# Patient Record
Sex: Female | Born: 1945 | Race: White | Hispanic: No | Marital: Married
Health system: Southern US, Community
[De-identification: ages and names within clinical notes are randomized; demographics above are authoritative.]

---

## 2019-06-01 DIAGNOSIS — I10 Essential (primary) hypertension: Secondary | ICD-10-CM | POA: Insufficient documentation

## 2019-06-01 DIAGNOSIS — K2270 Barrett's esophagus without dysplasia: Secondary | ICD-10-CM | POA: Insufficient documentation

## 2019-06-06 DIAGNOSIS — K219 Gastro-esophageal reflux disease without esophagitis: Secondary | ICD-10-CM | POA: Insufficient documentation

## 2019-06-06 DIAGNOSIS — R490 Dysphonia: Secondary | ICD-10-CM | POA: Insufficient documentation

## 2019-06-06 DIAGNOSIS — J382 Nodules of vocal cords: Secondary | ICD-10-CM | POA: Insufficient documentation

## 2019-08-08 DIAGNOSIS — H6123 Impacted cerumen, bilateral: Secondary | ICD-10-CM | POA: Insufficient documentation

## 2019-08-08 DIAGNOSIS — H900 Conductive hearing loss, bilateral: Secondary | ICD-10-CM | POA: Insufficient documentation

## 2019-11-07 ENCOUNTER — Ambulatory Visit (INDEPENDENT_AMBULATORY_CARE_PROVIDER_SITE_OTHER): Payer: Medicare Other | Admitting: Podiatry

## 2019-11-07 ENCOUNTER — Other Ambulatory Visit: Payer: Self-pay

## 2019-11-07 ENCOUNTER — Ambulatory Visit (INDEPENDENT_AMBULATORY_CARE_PROVIDER_SITE_OTHER): Payer: Medicare Other

## 2019-11-07 DIAGNOSIS — M25572 Pain in left ankle and joints of left foot: Secondary | ICD-10-CM | POA: Diagnosis not present

## 2019-11-07 DIAGNOSIS — G8929 Other chronic pain: Secondary | ICD-10-CM

## 2019-11-07 DIAGNOSIS — M659 Synovitis and tenosynovitis, unspecified: Secondary | ICD-10-CM

## 2019-11-10 ENCOUNTER — Telehealth: Payer: Self-pay | Admitting: Podiatry

## 2019-11-10 NOTE — Telephone Encounter (Signed)
Patient thought that you were going to order MRI for her?

## 2019-11-14 NOTE — Telephone Encounter (Signed)
Pt called and wanted to know about the MRI , pt states that she has a MRI disc from aprev dr in [place] with in the year can we use that or no she would like to know either way please advise

## 2019-11-15 NOTE — Progress Notes (Signed)
   HPI: 74 y.o. female presenting today as a new patient for evaluation of left ankle pain that is been going on for greater than 1 year now.  Patient was seeing another physician in [place] and also saw Dr. Elijah Birk, podiatry here in [place] who started cortisone injections to the left foot and ankle.  She continues to experience pain despite multiple conservative treatment modalities.  Pain is aggravated by walking and standing.  She has tried anti-inflammatory injections, ankle brace, cam boot immobilization, and anti-inflammatory oral Mobic without any significant improvement.  She presents today for further treatment evaluation  No past medical history on file.   Physical Exam: General: The patient is alert and oriented x3 in no acute distress.  Dermatology: Skin is warm, dry and supple bilateral lower extremities. Negative for open lesions or macerations.  Vascular: Palpable pedal pulses bilaterally. No edema or erythema noted. Capillary refill within normal limits.  Neurological: Epicritic and protective threshold grossly intact bilaterally.   Musculoskeletal Exam: Range of motion within normal limits to all pedal and ankle joints bilateral. Muscle strength 5/5 in all groups bilateral.  Tenderness to palpation noted to the medial anterior and lateral aspects of the left ankle joint.  History of first MTPJ arthrodesis left foot with negative range of motion to the first MTPJ  Radiographic Exam:  Normal osseous mineralization. Joint spaces preserved. No fracture/dislocation/boney destruction.  Hardware noted to the first MTPJ left foot consistent with the patient's given history of arthrodesis to the left great toe joint  Assessment: 1.  Chronic left ankle pain 2.  DJD/synovitis left ankle 3.  History of first MTPJ arthrodesis left   Plan of Care:  1. Patient evaluated. X-Rays reviewed.  2.  The patient has had this pain now for greater than 1 year and is tried multiple conservative  modalities without any improvement. 3.  Today we will order MRI left ankle 4.  Patient cannot take oral NSAIDs 5.  Return to clinic in 3 weeks to review MRI results      Felecia Shelling, DPM [facility]  Dr. Felecia Shelling, DPM    [address] [place], [place] [address]                Office [phone]  Fax [phone]

## 2019-11-16 ENCOUNTER — Telehealth: Payer: Self-pay | Admitting: *Deleted

## 2019-11-16 DIAGNOSIS — M25572 Pain in left ankle and joints of left foot: Secondary | ICD-10-CM

## 2019-11-16 DIAGNOSIS — G8929 Other chronic pain: Secondary | ICD-10-CM

## 2019-11-16 NOTE — Telephone Encounter (Signed)
Yes, I'm sorry. Please order MRI left ankle w/out contrast. Thanks, Dr. Logan Bores

## 2019-11-16 NOTE — Telephone Encounter (Signed)
Faxed orders, clinicals and demographics to [facility] for pre-cert and scheduling. 

## 2019-11-16 NOTE — Telephone Encounter (Signed)
-----   Message from Felecia Shelling, DPM sent at 11/15/2019  6:37 PM EDT ----- Regarding: MRI left ankle Please order MRI left ankle without contrast  Diagnosis: Chronic left ankle pain/DJD/synovitis greater than 1 year  Thanks, Dr. Logan Bores

## 2019-11-22 ENCOUNTER — Other Ambulatory Visit: Payer: Self-pay | Admitting: Podiatry

## 2019-11-22 DIAGNOSIS — M659 Synovitis and tenosynovitis, unspecified: Secondary | ICD-10-CM

## 2019-11-30 ENCOUNTER — Ambulatory Visit: Payer: Medicare Other | Admitting: Podiatry

## 2019-12-15 ENCOUNTER — Other Ambulatory Visit: Payer: Medicare Other

## 2019-12-21 ENCOUNTER — Other Ambulatory Visit: Payer: Medicare Other

## 2019-12-29 ENCOUNTER — Ambulatory Visit
Admission: RE | Admit: 2019-12-29 | Discharge: 2019-12-29 | Disposition: A | Discharge status: Home / Self Care | Payer: Medicare Other | Source: Ambulatory Visit | Attending: Podiatry | Admitting: Podiatry

## 2019-12-29 ENCOUNTER — Ambulatory Visit
Admission: RE | Admit: 2019-12-29 | Discharge: 2019-12-29 | Disposition: A | Discharge status: Home / Self Care | Payer: Self-pay | Source: Ambulatory Visit | Attending: Podiatry | Admitting: Podiatry

## 2019-12-29 ENCOUNTER — Other Ambulatory Visit: Payer: Self-pay

## 2019-12-29 ENCOUNTER — Other Ambulatory Visit: Payer: Self-pay | Admitting: Podiatry

## 2019-12-29 DIAGNOSIS — M25572 Pain in left ankle and joints of left foot: Secondary | ICD-10-CM

## 2019-12-29 DIAGNOSIS — G8929 Other chronic pain: Secondary | ICD-10-CM

## 2020-01-04 ENCOUNTER — Ambulatory Visit (INDEPENDENT_AMBULATORY_CARE_PROVIDER_SITE_OTHER): Payer: Medicare Other | Admitting: Podiatry

## 2020-01-04 ENCOUNTER — Other Ambulatory Visit: Payer: Self-pay

## 2020-01-04 DIAGNOSIS — M25572 Pain in left ankle and joints of left foot: Secondary | ICD-10-CM | POA: Diagnosis not present

## 2020-01-04 DIAGNOSIS — G8929 Other chronic pain: Secondary | ICD-10-CM

## 2020-01-04 DIAGNOSIS — M66872 Spontaneous rupture of other tendons, left ankle and foot: Secondary | ICD-10-CM

## 2020-01-04 DIAGNOSIS — M76822 Posterior tibial tendinitis, left leg: Secondary | ICD-10-CM

## 2020-01-04 NOTE — Progress Notes (Signed)
   HPI: 74 y.o. female presenting today for follow-up evaluation regarding left medial ankle pain.  Patient's had this pain for greater than 1 year despite multiple conservative treatment modalities.  At 1 point the patient was actually recommended to have surgery in [place] however she moved here to [place] without having surgery.  Last visit on 11/07/2019 MRI was ordered.  She presents today to review MRI results and discuss further treatment options.  There is been no change in her left ankle and she continues to have chronic pain  No past medical history on file.   Physical Exam: General: The patient is alert and oriented x3 in no acute distress.  Dermatology: Skin is warm, dry and supple bilateral lower extremities. Negative for open lesions or macerations.  Vascular: Palpable pedal pulses bilaterally. No edema or erythema noted. Capillary refill within normal limits.  Neurological: Epicritic and protective threshold grossly intact bilaterally.   Musculoskeletal Exam: Range of motion within normal limits to all pedal and ankle joints bilateral. Muscle strength 5/5 in all groups bilateral.  Pain on palpation along the posterior tibial tendon left  MRI impression 12/29/2019: 1. Severe tendinosis of the posterior tibial tendon with a partial-thickness tear proximally at the level of the distal tibia and mild tenosynovitis. 2. Mild tendinosis of the peroneus brevis. 3. A 5 mm osteochondral lesion involving the medial corner of the talar dome with partial-thickness overlying cartilage loss and mild subchondral marrow edema. Partial-thickness cartilage loss of the adjacent medial corner of the tibial plafond with subchondral marrow edema.  Assessment: 1.  Nontraumatic tear of posterior tibial tendon left 2.  Chronic posterior tibial tendinosis   Plan of Care:  1. Patient evaluated.  MRI reviewed.  2. Today we discussed the conservative versus surgical management of the  presenting pathology. The patient opts for surgical management. All possible complications and details of the procedure were explained. All patient questions were answered. No guarantees were expressed or implied. 3. Authorization for surgery was initiated today. Surgery will consist of repair posterior tibial tendon left with possible application of amniotic tissue graft 4.  Order placed for wheelchair.  Patient has a walker at home to use postoperatively 5.  Return to clinic 1 week postop        Felecia Shelling, DPM [facility]  Dr. Felecia Shelling, DPM    [address] [place], [place] [address]                Office [phone]  Fax [phone]

## 2020-01-05 ENCOUNTER — Telehealth: Payer: Self-pay

## 2020-01-05 NOTE — Telephone Encounter (Signed)
DOS 01/19/2020  REPAIR POSTERIOR TIBIAL TENDON LT - 28200  [employer] MEDICARE EFFECTIVE DATE - 07/15/19  PLAN DEDUCTIBLE - $0.00 OUT OF POCKET - Member's plan does not have an Out-of-Pocket Maximum  CO-INSURANCE 0% / Visit OUTPATIENT SURGERY 0% / Visit OUTPATIENT HOSPITAL COPAY $0 / Visit OUTPATIENT SURGERY $0 / Visit OUTPATIENT HOSPITAL  Notification or Prior Authorization is not required for the requested services  Decision ID #:[id number]

## 2020-01-11 ENCOUNTER — Other Ambulatory Visit: Payer: Self-pay | Admitting: Podiatry

## 2020-01-11 DIAGNOSIS — M25572 Pain in left ankle and joints of left foot: Secondary | ICD-10-CM

## 2020-01-11 DIAGNOSIS — M66872 Spontaneous rupture of other tendons, left ankle and foot: Secondary | ICD-10-CM

## 2020-01-11 DIAGNOSIS — G8929 Other chronic pain: Secondary | ICD-10-CM

## 2020-01-11 DIAGNOSIS — M76822 Posterior tibial tendinitis, left leg: Secondary | ICD-10-CM

## 2020-01-19 ENCOUNTER — Other Ambulatory Visit: Payer: Self-pay | Admitting: Podiatry

## 2020-01-19 ENCOUNTER — Encounter: Payer: Self-pay | Admitting: Podiatry

## 2020-01-19 DIAGNOSIS — M76822 Posterior tibial tendinitis, left leg: Secondary | ICD-10-CM | POA: Diagnosis not present

## 2020-01-19 DIAGNOSIS — M66872 Spontaneous rupture of other tendons, left ankle and foot: Secondary | ICD-10-CM | POA: Diagnosis not present

## 2020-01-19 MED ORDER — MELOXICAM 15 MG PO TABS: 15.0000 mg | ORAL_TABLET | Freq: Every day | ORAL | 1 refills | Status: DC

## 2020-01-19 MED ORDER — DOXYCYCLINE HYCLATE 100 MG PO TABS: 100.0000 mg | ORAL_TABLET | Freq: Two times a day (BID) | ORAL | 0 refills | Status: DC

## 2020-01-19 MED ORDER — OXYCODONE-ACETAMINOPHEN 5-325 MG PO TABS: 1.0000 | ORAL_TABLET | ORAL | 0 refills | Status: DC | PRN

## 2020-01-19 NOTE — Progress Notes (Signed)
PRN postop 

## 2020-01-25 ENCOUNTER — Other Ambulatory Visit: Payer: Self-pay

## 2020-01-25 ENCOUNTER — Ambulatory Visit (INDEPENDENT_AMBULATORY_CARE_PROVIDER_SITE_OTHER): Payer: Medicare Other | Admitting: Podiatry

## 2020-01-25 ENCOUNTER — Encounter: Payer: Self-pay | Admitting: Podiatry

## 2020-01-25 ENCOUNTER — Ambulatory Visit: Payer: Medicare Other

## 2020-01-25 DIAGNOSIS — M76822 Posterior tibial tendinitis, left leg: Secondary | ICD-10-CM

## 2020-01-25 DIAGNOSIS — Z9889 Other specified postprocedural states: Secondary | ICD-10-CM

## 2020-01-25 NOTE — Progress Notes (Signed)
   Subjective:  Patient presents today status post posterior tibial tendon repair left. DOS: 01/19/2020.  Patient states that she is doing very well.  She has no pain.  The cast is very comfortable.  She has been nonweightbearing as directed.  No new complaints at this time  No past medical history on file.    Objective/Physical Exam Neurovascular status intact.  Cast left intact today.  Capillary refill within normal limits.  Patient is able to move her toes comfortably.  No sign of infectious process noted. No dehiscence. No active bleeding noted.    Assessment: 1. s/p PT tendon repair left. DOS: 01/19/2020   Plan of Care:  1. Patient was evaluated. 2.  Cast left intact today.  Continue nonweightbearing 3.  Return to clinic in 2 weeks for cast removal and to initiate physical therapy.  At this time a cam boot will be dispensed and she will continue nonweightbearing for an additional week and then slowly transition to WB in the CAM boot 4.  When physical therapy is initiated, the patient requested to have physical therapy closer to their home  Felecia Shelling, DPM [facility]  Dr. Felecia Shelling, DPM    [address]                                        [place], [place] [address]                Office [phone]  Fax [phone]

## 2020-01-25 NOTE — Addendum Note (Signed)
Addended by: Hadley Pen R on: 01/25/2020 11:26 AM   Modules accepted: Orders

## 2020-02-01 ENCOUNTER — Encounter: Payer: Medicare Other | Admitting: Podiatry

## 2020-02-06 ENCOUNTER — Telehealth: Payer: Self-pay | Admitting: *Deleted

## 2020-02-06 NOTE — Telephone Encounter (Signed)
Pt states she had surgery on her ankle and will be coming into the office for a cast removal and wanted to know if she would be able to weight bear after the removal in a boot.

## 2020-02-06 NOTE — Telephone Encounter (Signed)
Left message informing pt Dr. Logan Bores is not in the office this week and that with this type of surgery she may be put back into a cast for positioning and protection and if she is put in a boot she would probably not be weight bearing, may be able to use a knee scooter.

## 2020-02-13 ENCOUNTER — Other Ambulatory Visit: Payer: Self-pay

## 2020-02-13 ENCOUNTER — Ambulatory Visit (INDEPENDENT_AMBULATORY_CARE_PROVIDER_SITE_OTHER): Payer: Medicare Other | Admitting: Podiatry

## 2020-02-13 DIAGNOSIS — M76822 Posterior tibial tendinitis, left leg: Secondary | ICD-10-CM

## 2020-02-13 DIAGNOSIS — M66872 Spontaneous rupture of other tendons, left ankle and foot: Secondary | ICD-10-CM

## 2020-02-13 DIAGNOSIS — Z9889 Other specified postprocedural states: Secondary | ICD-10-CM

## 2020-02-13 NOTE — Progress Notes (Signed)
   Subjective:  Patient presents today status post posterior tibial tendon repair left. DOS: 01/19/2020.  Patient states she continues to have no pain.  She has been nonweightbearing in the cast as directed.  No new complaints at this time.  She presents today for cast removal and to initiate physical therapy  No past medical history on file.  Objective/Physical Exam Neurovascular status intact.  Skin incisions appear to be well coapted with staples intact. No sign of infectious process noted. No dehiscence. No active bleeding noted. Moderate edema noted to the surgical extremity.  Assessment: 1. s/p PT tendon repair left. DOS: 01/19/2020  Plan of Care:  1. Patient was evaluated. 2.  Cast was removed today.  Patient may begin to transition from nonweightbearing to weightbearing in the cam boot.  Cam boot dispensed today. 3.  Prescription provided today for physical therapy.  Patient lives in [place], [place] and would like physical therapy closer to where they live.  Prescription provided for them to take to any physical therapy location 4.  Return to clinic in 4 weeks  *Going to visit friends next week in [place], [place].  Also going to [facility], end of September.  Felecia Shelling, DPM [facility]  Dr. Felecia Shelling, DPM    [address]                                        [place], [place] [address]                Office [phone]  Fax [phone]

## 2020-02-20 ENCOUNTER — Encounter: Payer: Medicare Other | Admitting: Podiatry

## 2020-03-21 ENCOUNTER — Encounter: Payer: Medicare Other | Admitting: Podiatry

## 2020-03-26 ENCOUNTER — Ambulatory Visit (INDEPENDENT_AMBULATORY_CARE_PROVIDER_SITE_OTHER): Payer: Medicare Other | Admitting: Podiatry

## 2020-03-26 ENCOUNTER — Other Ambulatory Visit: Payer: Self-pay

## 2020-03-26 ENCOUNTER — Ambulatory Visit (INDEPENDENT_AMBULATORY_CARE_PROVIDER_SITE_OTHER): Payer: Medicare Other

## 2020-03-26 DIAGNOSIS — Z9889 Other specified postprocedural states: Secondary | ICD-10-CM

## 2020-03-26 DIAGNOSIS — M76822 Posterior tibial tendinitis, left leg: Secondary | ICD-10-CM

## 2020-03-26 NOTE — Progress Notes (Signed)
   Subjective:  Patient presents today status post posterior tibial tendon repair left. DOS: 01/19/2020.  Patient states that she is doing very well.  She has been going to physical therapy that has helped significantly.  She just recently began to transition out of her cam boot into good supportive sneakers.  She continues to have some intermittent mild soreness to the area.  No new complaints at this time  No past medical history on file.  Objective/Physical Exam Neurovascular status intact.  There is some mild edema noted to the ankle around the surgical area.  There is some tenderness to palpation along the posterior tibial tendon as well.  Muscle strength and range of motion within normal limits all compartments.  Assessment: 1. s/p PT tendon repair left. DOS: 01/19/2020  Plan of Care:  1. Patient was evaluated. 2.  Continue physical therapy at [facility] 3.  Compression anklet dispensed.  Wear daily 4.  Patient may discontinue the cam boot.  Recommend good supportive sneakers.  Recommended [employer] 5.  Return to clinic in 2 months  *Going to visit friends next week in [place], [place].  Also going to [facility], end of September.  Felecia Shelling, DPM [facility]  Dr. Felecia Shelling, DPM    [address]                                        [place], [place] [address]                Office [phone]  Fax [phone]

## 2020-03-26 NOTE — Patient Instructions (Signed)
[employer] off of [address]

## 2020-05-28 ENCOUNTER — Encounter: Payer: Medicare Other | Admitting: Podiatry

## 2020-05-30 ENCOUNTER — Other Ambulatory Visit: Payer: Self-pay

## 2020-05-30 ENCOUNTER — Ambulatory Visit (INDEPENDENT_AMBULATORY_CARE_PROVIDER_SITE_OTHER): Payer: Medicare Other

## 2020-05-30 ENCOUNTER — Ambulatory Visit (INDEPENDENT_AMBULATORY_CARE_PROVIDER_SITE_OTHER): Payer: Medicare Other | Admitting: Podiatry

## 2020-05-30 DIAGNOSIS — Z9889 Other specified postprocedural states: Secondary | ICD-10-CM

## 2020-05-30 DIAGNOSIS — M76822 Posterior tibial tendinitis, left leg: Secondary | ICD-10-CM

## 2020-05-30 NOTE — Progress Notes (Signed)
   Subjective:  Patient presents today status post posterior tibial tendon repair left. DOS: 01/19/2020.  Patient states that she is doing very well.  She has been doing a lot of traveling over the past few months and she is doing well wearing good sneakers with minimal pain.  No past medical history on file.  Objective: Physical Exam General: The patient is alert and oriented x3 in no acute distress.  Dermatology: Skin is cool, dry and supple bilateral lower extremities. Negative for open lesions or macerations.  Vascular: Palpable pedal pulses bilaterally. No edema or erythema noted. Capillary refill within normal limits.  Neurological: Epicritic and protective threshold grossly intact bilaterally.   Musculoskeletal Exam: All pedal and ankle joints range of motion within normal limits bilateral. Muscle strength 5/5 in all groups bilateral.    Assessment: 1. s/p PT tendon repair left. DOS: 01/19/2020  Plan of Care:  1. Patient was evaluated. 2.  Continue compression ankle sleeve daily as needed 3.  Continue wearing good supportive shoes 4.  Patient may now resume full activity no restrictions 5.  Return to clinic as needed  *Going to visit friends next week in [place], [place].  Also going to [facility], end of September.  Felecia Shelling, DPM [facility]  Dr. Felecia Shelling, DPM    [address]                                        [place], [place] [address]                Office [phone]  Fax [phone]

## 2021-01-21 DIAGNOSIS — K2100 Gastro-esophageal reflux disease with esophagitis, without bleeding: Secondary | ICD-10-CM | POA: Insufficient documentation

## 2021-01-21 DIAGNOSIS — E785 Hyperlipidemia, unspecified: Secondary | ICD-10-CM | POA: Insufficient documentation

## 2021-01-27 IMAGING — MR MR ANKLE*L* W/O CM
5 series · 36 of 40 positions shown · non-contrast
Comparison: None.

CLINICAL DATA: Left ankle pain chronically. Ongoing for 1 year.

EXAM:
MRI OF THE LEFT ANKLE WITHOUT CONTRAST
TECHNIQUE: Multiplanar, multisequence MR imaging of the ankle was performed. No
intravenous contrast was administered.

[Series 4: T2 fat-sat · axial · 3.0mm · 0.50mm/px · z∈[-83,+38]mm · 9 of 32 slices shown (1 of 2)]
[im 1/32]
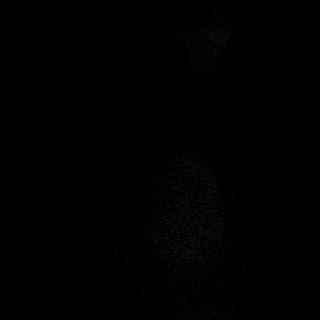
[im 4/32]
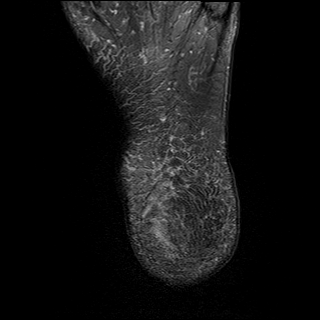
[im 8/32]
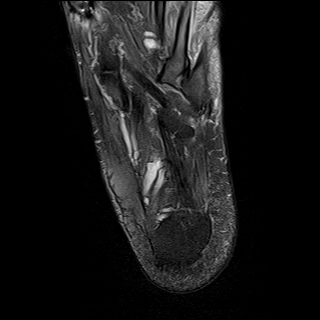
[im 12/32]
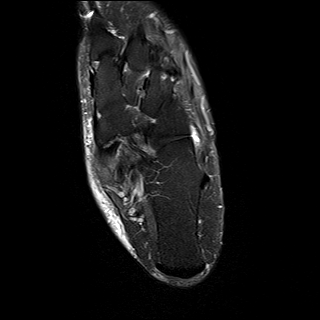
[im 16/32]
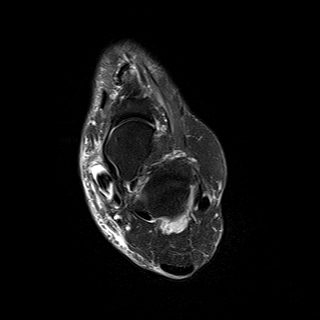
[im 20/32]
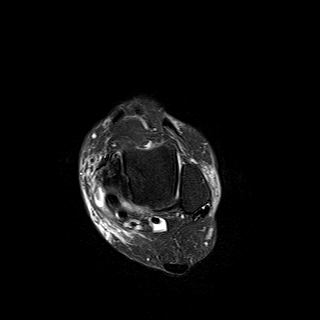
[im 24/32]
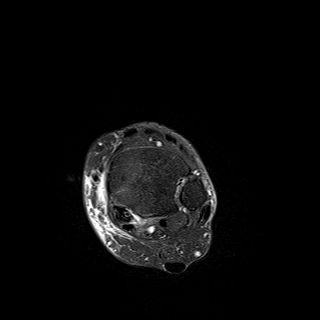
[im 28/32]
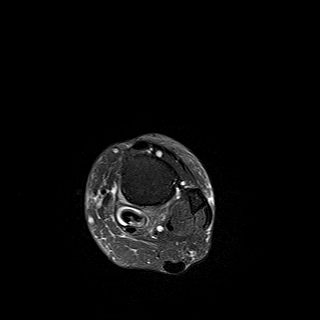
[im 32/32]
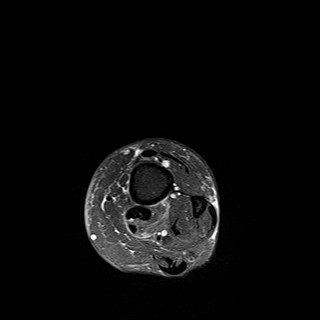

[Series 5: PD fat-sat · axial · 3.0mm · 0.42mm/px · z∈[-83,+38]mm · 9 of 32 slices shown]
[im 1/32]
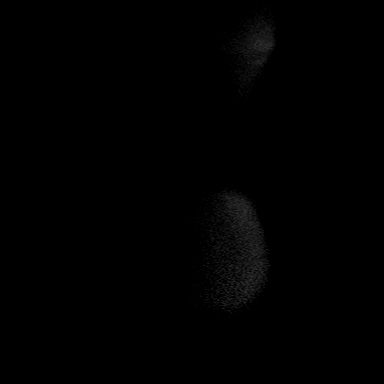
[im 4/32]
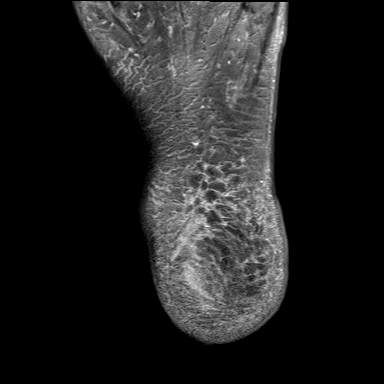
[im 8/32]
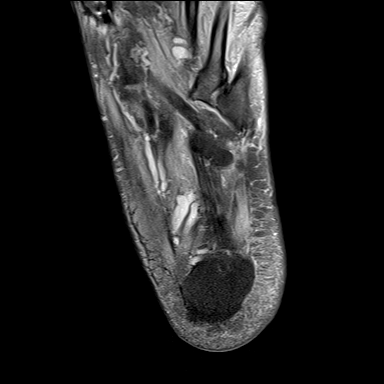
[im 12/32]
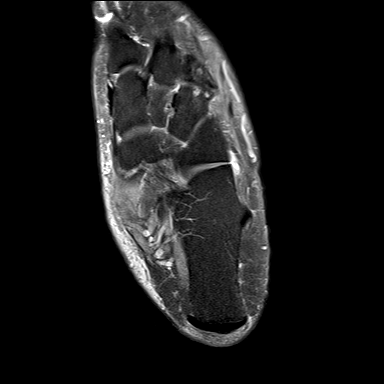
[im 16/32]
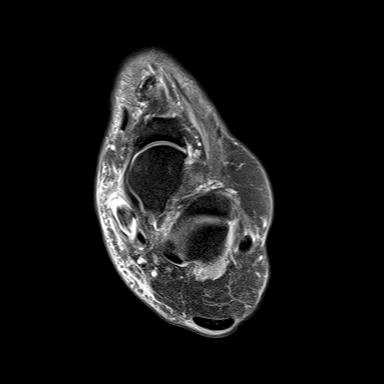
[im 20/32]
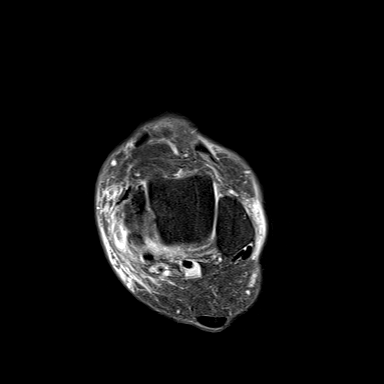
[im 24/32]
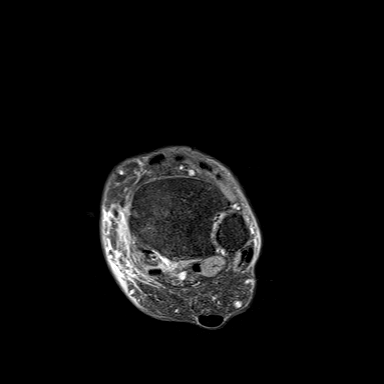
[im 28/32]
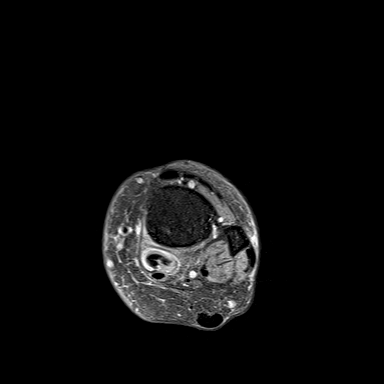
[im 32/32]
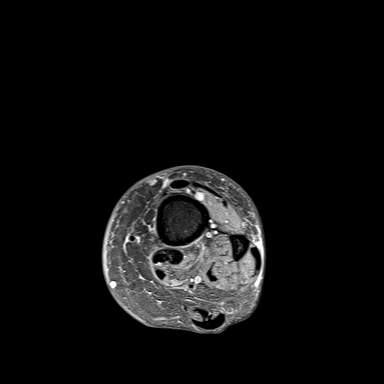

[Series 6: T1 · sagittal · 4.0mm · 0.56mm/px · 6 of 20 slices shown]
[im 1/20]
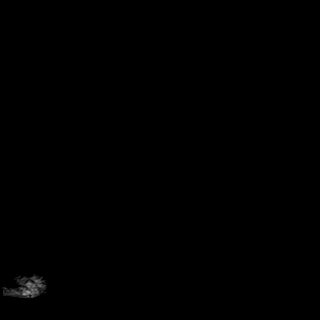
[im 4/20]
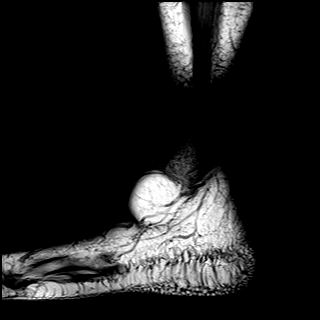
[im 8/20]
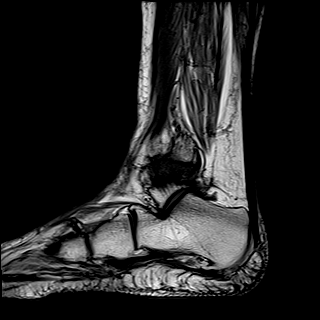
[im 12/20]
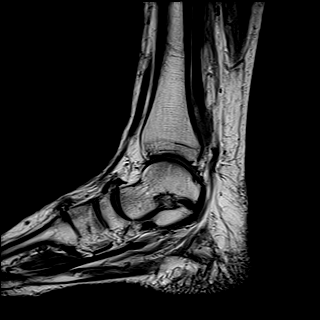
[im 16/20]
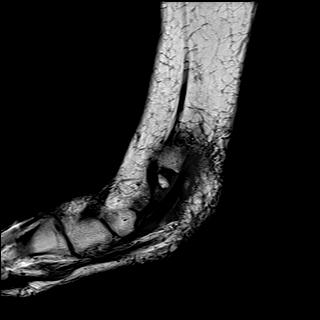
[im 20/20]
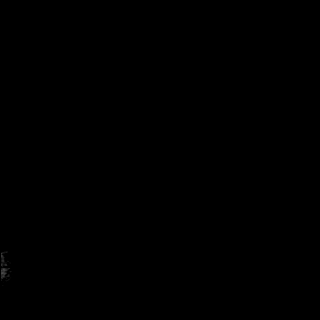

[Series 7: STIR · sagittal · 4.0mm · 0.35mm/px · 4 of 20 slices shown]
[im 1/20]
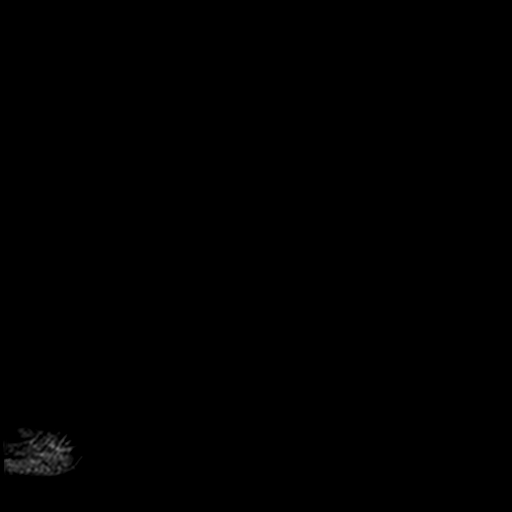
[im 4/20]
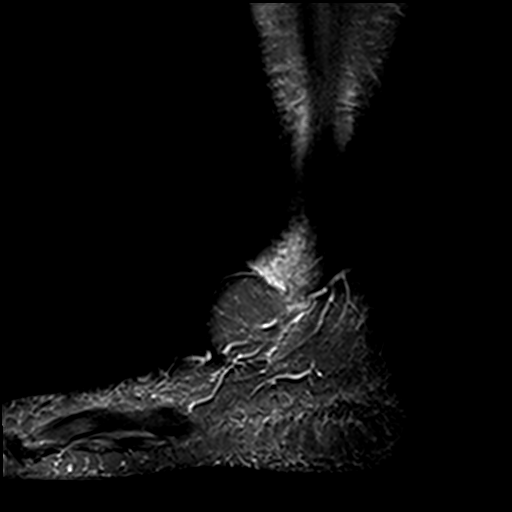
[im 8/20]
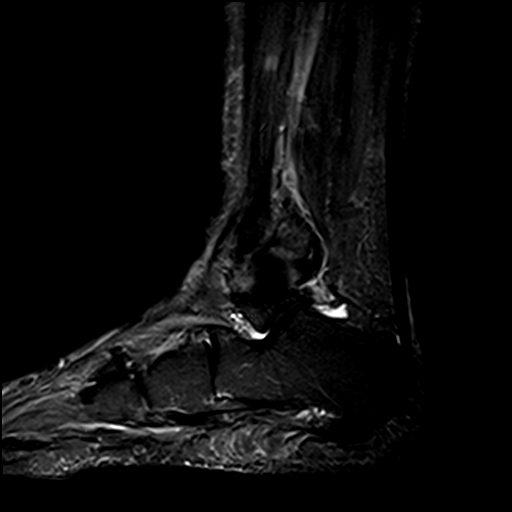
[im 12/20]
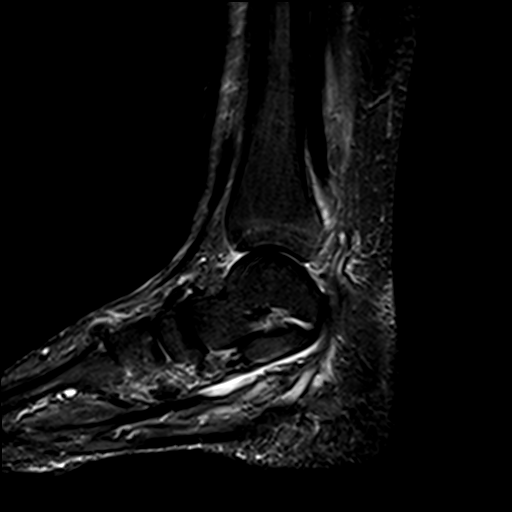

[Series 9: T2 fat-sat · coronal · 3.0mm · 0.50mm/px · 8 of 34 slices shown (2 of 2)]
[im 1/34]
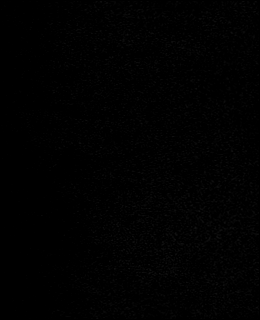
[im 4/34]
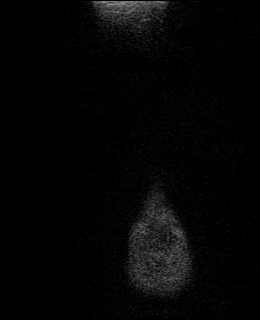
[im 12/34]
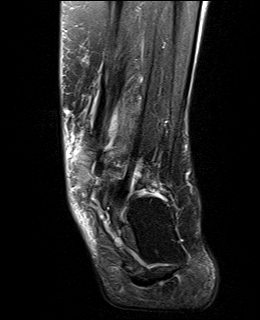
[im 15/34]
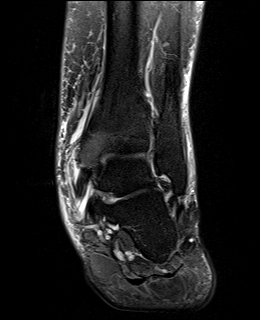
[im 19/34]
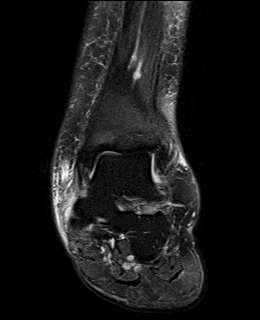
[im 23/34]
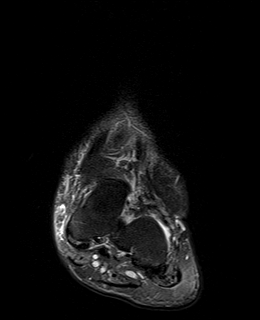
[im 30/34]
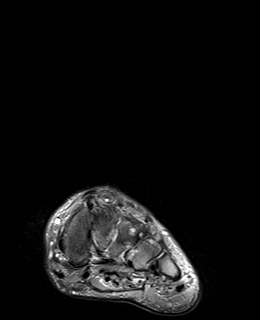
[im 34/34]
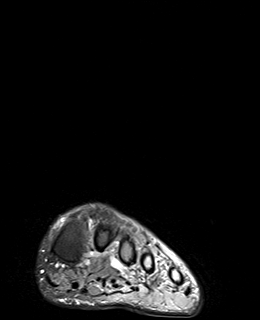

[36 of 40 positions shown; findings below may reference images not displayed]

FINDINGS: TENDONS

Peroneal: Peroneal longus tendon intact. Mild tendinosis of the
peroneus brevis.

Posteromedial: Severe tendinosis of the posterior tibial tendon with
a partial-thickness tear proximally at the level of the distal tibia
and mild tenosynovitis. Mild adjacent soft tissue edema. Flexor
hallucis longus tendon intact. Flexor digitorum longus tendon
intact.

Anterior: Tibialis anterior tendon intact. Extensor hallucis longus
tendon intact Extensor digitorum longus tendon intact.

Achilles:  Intact.

Plantar Fascia: Intact.

LIGAMENTS

Lateral: Anterior talofibular ligament intact. Calcaneofibular
ligament intact. Posterior talofibular ligament intact. Anterior and
posterior tibiofibular ligaments intact.

Medial: Deltoid ligament intact. Spring ligament intact.

CARTILAGE

Ankle Joint: No joint effusion. Normal ankle mortise. 5 mm
osteochondral lesion involving the medial corner of the talar dome
with partial-thickness overlying cartilage loss and mild subchondral
marrow edema. Partial-thickness cartilage loss of the adjacent
medial corner of the tibial plafond with subchondral marrow edema.

Subtalar Joints/Sinus Tarsi: Normal subtalar joints. No subtalar
joint effusion. Normal sinus tarsi.

Bones: No acute fracture or dislocation. Mild osteoarthritis of the
talonavicular joint. Moderate osteoarthritis of the second and third
tarsometatarsal joints.

Soft Tissue: No fluid collection or hematoma. Muscles are normal.
IMPRESSION: 1. Severe tendinosis of the posterior tibial tendon with a
partial-thickness tear proximally at the level of the distal tibia
and mild tenosynovitis.
2. Mild tendinosis of the peroneus brevis.
3. A 5 mm osteochondral lesion involving the medial corner of the
talar dome with partial-thickness overlying cartilage loss and mild
subchondral marrow edema. Partial-thickness cartilage loss of the
adjacent medial corner of the tibial plafond with subchondral marrow
edema.

## 2021-07-24 DIAGNOSIS — E669 Obesity, unspecified: Secondary | ICD-10-CM | POA: Insufficient documentation

## 2021-08-08 ENCOUNTER — Ambulatory Visit (INDEPENDENT_AMBULATORY_CARE_PROVIDER_SITE_OTHER): Payer: Medicare Other

## 2021-08-08 ENCOUNTER — Encounter: Payer: Self-pay | Admitting: Podiatry

## 2021-08-08 ENCOUNTER — Ambulatory Visit (INDEPENDENT_AMBULATORY_CARE_PROVIDER_SITE_OTHER): Payer: Medicare Other | Admitting: Podiatry

## 2021-08-08 ENCOUNTER — Other Ambulatory Visit: Payer: Self-pay

## 2021-08-08 DIAGNOSIS — M19079 Primary osteoarthritis, unspecified ankle and foot: Secondary | ICD-10-CM | POA: Diagnosis not present

## 2021-08-08 DIAGNOSIS — R059 Cough, unspecified: Secondary | ICD-10-CM | POA: Insufficient documentation

## 2021-08-08 DIAGNOSIS — M76822 Posterior tibial tendinitis, left leg: Secondary | ICD-10-CM

## 2021-08-08 DIAGNOSIS — M2011 Hallux valgus (acquired), right foot: Secondary | ICD-10-CM | POA: Diagnosis not present

## 2021-08-08 DIAGNOSIS — M7752 Other enthesopathy of left foot: Secondary | ICD-10-CM

## 2021-08-08 DIAGNOSIS — M7750 Other enthesopathy of unspecified foot: Secondary | ICD-10-CM | POA: Diagnosis not present

## 2021-08-08 DIAGNOSIS — M7751 Other enthesopathy of right foot: Secondary | ICD-10-CM

## 2021-08-08 DIAGNOSIS — J189 Pneumonia, unspecified organism: Secondary | ICD-10-CM | POA: Insufficient documentation

## 2021-08-08 NOTE — Patient Instructions (Signed)
Recommended shoe stores  Artist

## 2021-08-15 NOTE — Progress Notes (Signed)
°  Subjective:  Patient ID: Patricia Hernandez, female    DOB: 07/21/1945,  MRN: [id number]  Chief Complaint  Patient presents with   Routine Post Op    I had surgery with Dr Logan Bores and the left foot and ankle is swelling and hurts with shoes   76 y.o. female presents with the above complaint. History confirmed with patient.  States that the left foot and ankle are hurting shoes.  Complains of pain to the middle of her foot and continued swelling about the medial ankle.  Also complains of pain to the right bunion, endorses history of surgery of the left foot bunion  Objective:  Physical Exam: warm, good capillary refill, no trophic changes or ulcerative lesions, normal DP and PT pulses, and normal sensory exam. Left Foot: Mild pain to palpation with edema about the medial ankle over the posterior tibial tendon area.  Pain palpation about the dorsal midfoot and anterior ankle. Right Foot: Severe hallux valgus deformity with pain to palpation, clinically stepped  No images are attached to the encounter.  Radiographs: X-ray of both feet:  Right foot degenerative change of the first metatarsal phalangeal joint posterior and plantar calcaneal spurring Left foot evidence of prior fusion and metatarsal osteotomies.  No hardware failure nonunion noted.  Severe midfoot and hindfoot degenerative changes with dorsal spurring  Assessment:   1. Tendonitis of ankle or foot   2. Posterior tibial tendinitis, left   3. Arthritis of midfoot   4. Acquired hallux valgus of right foot      Plan:  Patient was evaluated and treated and all questions answered.  Posterior tibial tendinitis -I discussed the patient I think that it is worth considering an MRI to evaluate for retear or other injury to the tendon that could be continuing to cause swelling.  Patient is not interested in this at this time.  I discussed continues to be ankle brace and patient does not want to try that.  We did agree on trying some  compression socks to see if that can help to some edema  Arthritis midfoot -Offered injection patient declined stating that she did want temporary fix for this.  But also not interested in surgery.  Will defer further treatment at this time.  Right hallux valgus -Severe bunion deformity discussed treatment including padding patient not interested in any more aggressive treatments at this time  Advised patient to follow-up if she like to consider some of these options No follow-ups on file.

## 2021-09-19 ENCOUNTER — Ambulatory Visit: Payer: Medicare Other | Admitting: Podiatry

## 2021-10-28 ENCOUNTER — Encounter: Payer: Self-pay | Admitting: Podiatry

## 2021-10-28 ENCOUNTER — Ambulatory Visit (INDEPENDENT_AMBULATORY_CARE_PROVIDER_SITE_OTHER): Payer: Medicare Other | Admitting: Podiatry

## 2021-10-28 DIAGNOSIS — M722 Plantar fascial fibromatosis: Secondary | ICD-10-CM

## 2021-10-28 MED ORDER — DEXAMETHASONE SODIUM PHOSPHATE 120 MG/30ML IJ SOLN
4.0000 mg | Freq: Once | INTRAMUSCULAR | Status: AC
  Administered 2021-10-28: 4 mg via INTRA_ARTICULAR

## 2021-10-28 MED ORDER — METHYLPREDNISOLONE 4 MG PO TBPK: ORAL_TABLET | ORAL | 0 refills | Status: DC

## 2021-10-28 NOTE — Patient Instructions (Signed)

## 2021-10-28 NOTE — Progress Notes (Signed)
?  Subjective:  ?Patient ID: Patricia Hernandez, female    DOB: [birth year],   MRN: [id number] ? ?Chief Complaint  ?Patient presents with  ? Plantar Fasciitis  ?  Heel pain on left and swells at times and could not walk and had to get a walker and better as the day goes on and I had surgery Dr Amalia Hailey 1 1/2 years ago  ? ? ?76 y.o. female presents for concern of left foot and heel pain. Relates it hurts when walking and swells all the time. Relates she had surgery with Dr. Amalia Hailey about a year and a half ago. She relates burning. Relates the pain comes and goes but has had to use a walker at times. Denies any treatments. . Denies any other pedal complaints. Denies n/v/f/c.  ? ?History reviewed. No pertinent past medical history. ? ?Objective:  ?Physical Exam: ?Vascular: DP/PT pulses 2/4 bilateral. CFT <3 seconds. Normal hair growth on digits. No edema.  ?Skin. No lacerations or abrasions bilateral feet.  ?Musculoskeletal: MMT 5/5 bilateral lower extremities in DF, PF, Inversion and Eversion. Deceased ROM in DF of ankle joint. Tender to medial calcaneal tubercle on the left. No pain along achilles or PT tendon. No pain with calcaneal squeeze.  ?Neurological: Sensation intact to light touch.  ? ?Assessment:  ? ?1. Plantar fasciitis of left foot   ? ? ? ?Plan:  ?Patient was evaluated and treated and all questions answered. ?Discussed plantar fasciitis with patient.  ?X-rays reviewed and discussed with patient. No acute fractures or dislocations noted. Mild spurring noted at inferior calcaneus.  ?Discussed treatment options including, ice, NSAIDS, supportive shoes, bracing, and stretching. Stretching exercises provided to be done on a daily basis.   ?PF brace dispensed.  ?Patient requesting injection today. Procedure note below.   ?Follow-up 6 weeks or sooner if any problems arise. In the meantime, encouraged to call the office with any questions, concerns, change in symptoms.  ? ?Procedure:  ?Discussed etiology, pathology,  conservative vs. surgical therapies. At this time a plantar fascial injection was recommended.  The patient agreed and a sterile skin prep was applied.  An injection consisting of  dexamethasone and marcaine mixture was infiltrated at the point of maximal tenderness on the left Heel.  Bandaid applied. The patient tolerated this well and was given instructions for aftercare.  ? ? ?Lorenda Peck, DPM  ? ? ?

## 2021-11-25 ENCOUNTER — Ambulatory Visit (INDEPENDENT_AMBULATORY_CARE_PROVIDER_SITE_OTHER): Payer: Medicare Other | Admitting: Podiatry

## 2021-11-25 ENCOUNTER — Encounter: Payer: Self-pay | Admitting: Podiatry

## 2021-11-25 DIAGNOSIS — M722 Plantar fascial fibromatosis: Secondary | ICD-10-CM | POA: Diagnosis not present

## 2021-11-25 NOTE — Progress Notes (Signed)
?  Subjective:  ?Patient ID: Patricia Hernandez , female    DOB: Sep 26, 1945,   MRN: [id number] ? ?Chief Complaint  ?Patient presents with  ? Plantar Fasciitis  ?  The brace I am not sure helps and the injection I know helped and it is sore some days   ? ? ?76 y.o. female presents for follow-up of left plantar fasciitis.  States it is doing more than 50% better but not quite 100%. States the injection did help and has continued to help. States the brace she is not sure helps. Has been stretching regularly throughout the day . Denies any other pedal complaints. Denies n/v/f/c.  ? ?History reviewed. No pertinent past medical history. ? ?Objective:  ?Physical Exam: ?Vascular: DP/PT pulses 2/4 bilateral. CFT <3 seconds. Normal hair growth on digits. No edema.  ?Skin. No lacerations or abrasions bilateral feet.  ?Musculoskeletal: MMT 5/5 bilateral lower extremities in DF, PF, Inversion and Eversion. Deceased ROM in DF of ankle joint. Minimally tender to medial calcaneal tubercle on the left. No pain along achilles or PT tendon. No pain with calcaneal squeeze.  ?Neurological: Sensation intact to light touch.  ? ?Assessment:  ? ?1. Plantar fasciitis of left foot   ? ? ? ? ?Plan:  ?Patient was evaluated and treated and all questions answered. ?Discussed plantar fasciitis with patient.  ?X-rays reviewed and discussed with patient. No acute fractures or dislocations noted. Mild spurring noted at inferior calcaneus.  ?Discussed treatment options including, ice, NSAIDS, supportive shoes, bracing, and stretching. ?Contnue stretching and brace.  ?Continue tylenol as needed.  ?Follow-up as needed.  ? ? ? ?Louann Sjogren, DPM  ? ? ?

## 2022-09-08 ENCOUNTER — Ambulatory Visit: Payer: Medicare Other | Admitting: Physician Assistant

## 2022-09-09 ENCOUNTER — Ambulatory Visit (INDEPENDENT_AMBULATORY_CARE_PROVIDER_SITE_OTHER): Payer: Medicare Other

## 2022-09-09 ENCOUNTER — Ambulatory Visit (INDEPENDENT_AMBULATORY_CARE_PROVIDER_SITE_OTHER): Payer: Medicare Other | Admitting: Orthopaedic Surgery

## 2022-09-09 DIAGNOSIS — M7062 Trochanteric bursitis, left hip: Secondary | ICD-10-CM

## 2022-09-09 DIAGNOSIS — M25552 Pain in left hip: Secondary | ICD-10-CM
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
## 2022-09-18 ENCOUNTER — Encounter: Payer: Self-pay | Admitting: Radiology

## 2023-02-18 ENCOUNTER — Ambulatory Visit (INDEPENDENT_AMBULATORY_CARE_PROVIDER_SITE_OTHER): Payer: Medicare Other | Admitting: Podiatry

## 2023-02-18 ENCOUNTER — Ambulatory Visit (INDEPENDENT_AMBULATORY_CARE_PROVIDER_SITE_OTHER): Payer: Medicare Other

## 2023-02-18 DIAGNOSIS — L853 Xerosis cutis: Secondary | ICD-10-CM | POA: Diagnosis not present

## 2023-02-18 DIAGNOSIS — M7752 Other enthesopathy of left foot: Secondary | ICD-10-CM

## 2023-02-18 DIAGNOSIS — M205X2 Other deformities of toe(s) (acquired), left foot: Secondary | ICD-10-CM | POA: Diagnosis not present

## 2023-02-18 DIAGNOSIS — M21611 Bunion of right foot: Secondary | ICD-10-CM | POA: Diagnosis not present

## 2023-02-18 DIAGNOSIS — M722 Plantar fascial fibromatosis: Secondary | ICD-10-CM | POA: Diagnosis not present

## 2023-02-18 NOTE — Progress Notes (Unsigned)
Chief Complaint  Patient presents with   Foot Pain    C/o pain to the left 5th toe. Patient stated last week the area was very tender to touch. The pain is better today. X-rays were obtained today.    Patient presents with several concerns today.  She notes that the left fifth toe was very tender approximately 2 weeks ago.  She states that it is much better today.  She did not seek treatment elsewhere for this, stating that it has improved on its own.  She does not recall injury.  She also notes some dry hard skin on the left great toe.  She is also pointing to the bunion area on her right foot and was asking if there is anything she needs to be concerned about or if she needs to consider surgery.  No past medical history on file.  No past surgical history on file.  Allergies  Allergen Reactions   Meloxicam     Physical Exam: There were no vitals filed for this visit.  General: The patient is alert and oriented x3 in no acute distress.  Dermatology: Skin is warm, dry and supple bilateral lower extremities. Interspaces are clear of maceration and debris.  There are no open lesions or interdigital macerations near the fifth toe on the left foot.  Minimal edema left fifth toe.  Minimal dry, hard skin on plantar medial aspect of the left hallux IPJ and MPJ.  Vascular: Palpable pedal pulses bilaterally. Capillary refill within normal limits.    No erythema or calor.  Neurological: Light touch sensation grossly intact bilateral feet.   Musculoskeletal Exam: Adductovarus deformity of left third fourth and fifth toes.  Mild pain on palpation to the fifth toe on exam.  No crepitus with range of motion of the fifth toe.  No ecchymosis noted.  Mild bony prominence on the medial aspect of the first metatarsal head of the right foot consistent with bunion deformity.  Radiographic Exam (left foot, 3 weightbearing views, 02/18/2023):  Normal osseous mineralization.  There is evidence of  previous first MPJ arthrodesis with plate and screws intact.  There is also evidence of previous second metatarsal head osteotomy with 2 screws in place.  Evidence of prior tailor's bunionectomy noted.  There is medial angulation of the distal phalanx of the left third fourth and fifth toes on x-ray.  There is dorsal spurring seen at the second metatarsal-intermediate cuneiform joint.  No fracture seen  Assessment/Plan of Care: 1. Capsulitis of toe of left foot   2. Xerosis of skin   3. Bunion, right foot   4. Adductovarus rotation of toe, acquired, left     Discussed clinical and radiographic findings with patient today.  I informed the patient that her symptoms are suspicious for gout of the fifth toe especially since it has been resolving on its own after 2 weeks of significant pain and swelling.  Advised the patient that if this starts to flareup again that she can call the office and we can at least get her set up for blood work to check serum uric acid, CBC with differential, sed rate, and possible CRP values.  Recommended urea 40 cream for the hard dry skin at the hallux IPJ and first MPJ.  She can put this on at bedtime.  Discussed the bunion on the right foot.  This is typically a progressive orthopedic deformity.  Informed the patient that if the bunion becomes symptomatic or if there is any kind  of abrupt change in the position of the toe, she should to have surgical correction performed.  Discussed toe spacers and bunion splints to help prevent progression of the deformity.  She can purchase those off of [employer].  Follow-up as needed   Clerance Lav, DPM, FACFAS [facility]     [address] [place], [place] [address]                Office [phone]  Fax [phone]

## 2023-09-17 ENCOUNTER — Ambulatory Visit: Payer: Medicare Other | Admitting: Podiatry

## 2023-12-03 ENCOUNTER — Ambulatory Visit (INDEPENDENT_AMBULATORY_CARE_PROVIDER_SITE_OTHER): Admitting: Orthopaedic Surgery

## 2023-12-03 ENCOUNTER — Other Ambulatory Visit (INDEPENDENT_AMBULATORY_CARE_PROVIDER_SITE_OTHER)

## 2023-12-03 DIAGNOSIS — M25551 Pain in right hip: Secondary | ICD-10-CM

## 2023-12-03 DIAGNOSIS — M25552 Pain in left hip: Secondary | ICD-10-CM | POA: Diagnosis not present

## 2023-12-03 MED ORDER — METHYLPREDNISOLONE ACETATE 40 MG/ML IJ SUSP
40.0000 mg | INTRAMUSCULAR | Status: AC | PRN
  Administered 2023-12-03: 40 mg via INTRA_ARTICULAR

## 2023-12-03 MED ORDER — LIDOCAINE HCL 1 % IJ SOLN
3.0000 mL | INTRAMUSCULAR | Status: AC | PRN
  Administered 2023-12-03: 3 mL

## 2023-12-03 NOTE — Progress Notes (Signed)
 The patient comes in today with bilateral hip issues she states but is really her left hip that bothers her and she points to the lateral aspect of the hip as source of her pain.  She is 78 years old.  She says she feels like the hip gives out on her.  She denies any groin pain.  Both hips move smoothly including no pain at all.  When she first stands up she does have pain over the lateral aspect only of her left hip.  When she lays down flat on the back and turns to the right with her left side up there is significant pain all around the trochanteric area of her hip and her proximal IT band and she said that is definitely the source of her pain.  An AP pelvis and lateral of the hips shows normal-appearing hips bilaterally.  There are no cortical irregularities.  Her signs and symptoms are suggestive of trochanteric bursitis and IT band syndrome.  I recommended a steroid injection over this area of maximal tenderness and she agreed to this and tolerated well.  She defers any physical therapy at this time but I did write down some key phrases where her and her husband can look on the computer and [employer] and look at some exercises.  If things do not get better she is to reach out and let us  know.    Procedure Note  Patient: Patricia Hernandez             Date of Birth: June 01, 1946           MRN: [id number]             Visit Date: 12/03/2023  Procedures: Visit Diagnoses:  1. Pain of left hip   2. Pain of right hip     Large Joint Inj: L greater trochanter on 12/03/2023 3:47 PM Indications: pain and diagnostic evaluation Details: 22 G 1.5 in needle, lateral approach  Arthrogram: No  Medications: 3 mL lidocaine 1 %; 40 mg methylPREDNISolone  acetate 40 MG/ML Outcome: tolerated well, no immediate complications Procedure, treatment alternatives, risks and benefits explained, specific risks discussed. Consent was given by the patient. Immediately prior to procedure a time out was called to verify the  correct patient, procedure, equipment, support staff and site/side marked as required. Patient was prepped and draped in the usual sterile fashion.

## 2023-12-23 ENCOUNTER — Ambulatory Visit: Admitting: Orthopaedic Surgery

## 2024-05-16 ENCOUNTER — Encounter: Payer: Self-pay | Admitting: Radiology

## 2024-06-23 ENCOUNTER — Ambulatory Visit: Admitting: Podiatry

## 2024-07-06 ENCOUNTER — Ambulatory Visit: Admitting: Podiatry

## 2024-07-06 ENCOUNTER — Ambulatory Visit (INDEPENDENT_AMBULATORY_CARE_PROVIDER_SITE_OTHER)

## 2024-07-06 DIAGNOSIS — L84 Corns and callosities: Secondary | ICD-10-CM | POA: Diagnosis not present

## 2024-07-06 DIAGNOSIS — B351 Tinea unguium: Secondary | ICD-10-CM | POA: Diagnosis not present

## 2024-07-06 DIAGNOSIS — L603 Nail dystrophy: Secondary | ICD-10-CM | POA: Diagnosis not present

## 2024-07-06 DIAGNOSIS — M7752 Other enthesopathy of left foot: Secondary | ICD-10-CM | POA: Diagnosis not present

## 2024-07-06 NOTE — Progress Notes (Signed)
" °  °  Chief Complaint  Patient presents with   Foot Pain    Left hallux toe tip, callused. She did mention that the right foot bunion is starting to hurt. I did let her know that based on time we will have to schedule a different apt.  Not diabetic, No anti coag.    HPI: 78 y.o. female presents today with concern of pain along the inside of her left great toe.  She does acknowledge a callus along that side.  She is not sure if she has an ingrown toenail along the border as well.  She also has some discomfort to the left third toenail stating it is thick difficult to trim.  She has other concerns about a right bunion, but it was requested that she make a separate appointment for full evaluation and x-ray of the right foot.  Will focus on her concerns of the left foot today.  No past medical history on file. No past surgical history on file. Allergies[1]   Physical Exam: Palpable pedal pulses are noted.  No open lesions are seen.  There is diffuse hyperkeratosis on the plantar medial aspect of the left hallux.  There is mild incurvation of the left hallux nail along the distal 50%.  No signs of paronychia are noted.  The left third toenail is discolored and dystrophic without any evidence of onychomycosis at this time.  Radiographic Exam (left foot, 3 weightbearing views, 07/06/2024):  Normal osseous mineralization. No fractures noted.  Previous fusion of the left first MPJ noted.  There are contractures of the lesser toes at the DIPJ's.  There is some valgus position of the distal phalanx of the hallux noted.  Assessment/Plan of Care: 1. Callus of foot   2. Capsulitis of toe of left foot   3. Dermatophytosis of nail   4. Onychodystrophy     Reviewed x-rays with the patient today.  Her previous fusion does require placement of the hallux and into a slightly valgus position for gait.  This valgus position is causing increased pressure to the medial side of the great toe and most likely  aggravating her callus.  She may perform better with regular pumice shaving of the callus on a weekly basis as well as a shoe with a rocker-bottom.  Brands to try would be [employer] or [employer] shoes.  The callus was shaved in the left 1st and 3rd toenails were debrided to decrease thickness and cut back the medial border of the left hallux.  No further care is needed at this time to these areas.  Follow-up as needed for right foot evaluation.   Awanda CHARM Imperial, DPM, FACFAS [facility]     [address] [place], [place] [address]                Office [phone]  Fax [phone]    [1]  Allergies Allergen Reactions   Meloxicam     "
